# Patient Record
Sex: Male | Born: 1965 | Race: White | Hispanic: No | Marital: Married | Smoking: Current some day smoker
Health system: Southern US, Community
[De-identification: ages and names within clinical notes are randomized; demographics above are authoritative.]

## PROBLEM LIST (undated history)

## (undated) DIAGNOSIS — N200 Calculus of kidney: Secondary | ICD-10-CM

## (undated) DIAGNOSIS — I10 Essential (primary) hypertension: Secondary | ICD-10-CM

## (undated) HISTORY — PX: FRACTURE SURGERY: SHX138

## (undated) HISTORY — PX: ABDOMINAL SURGERY: SHX537

---

## 2004-10-25 ENCOUNTER — Inpatient Hospital Stay: Payer: Self-pay | Admitting: Internal Medicine

## 2004-10-28 ENCOUNTER — Inpatient Hospital Stay: Payer: Self-pay | Admitting: General Surgery

## 2004-11-10 ENCOUNTER — Emergency Department: Payer: Self-pay | Admitting: Emergency Medicine

## 2005-12-04 IMAGING — CT CT ABDOMEN AND PELVIS WITHOUT AND WITH CONTRAST
3 of 9 series · 14 of 32 positions shown, 19 images · non-contrast
Comparison: none

REASON FOR EXAM: (1) colovesical fistula, possible stone; (2) colovesical
fistula
COMMENTS:

[Series 4: without inspace · axial · non-contrast · 0.71mm/px · z∈[-438,-100]mm · 6 of 474 slices shown, 11 images]
[im 68/474  soft-tissue]
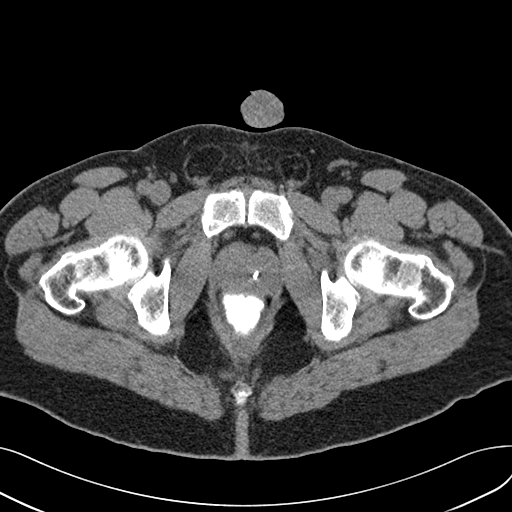
[im 68/474  bone]
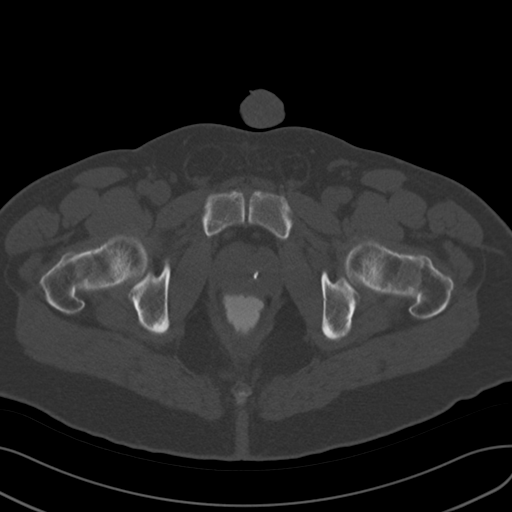
[im 136/474  soft-tissue]
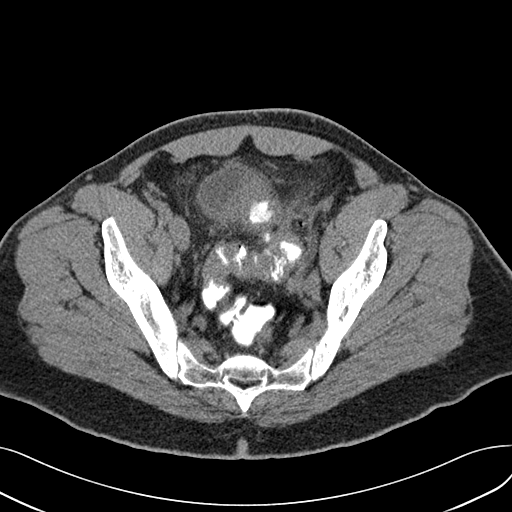
[im 203/474  soft-tissue]
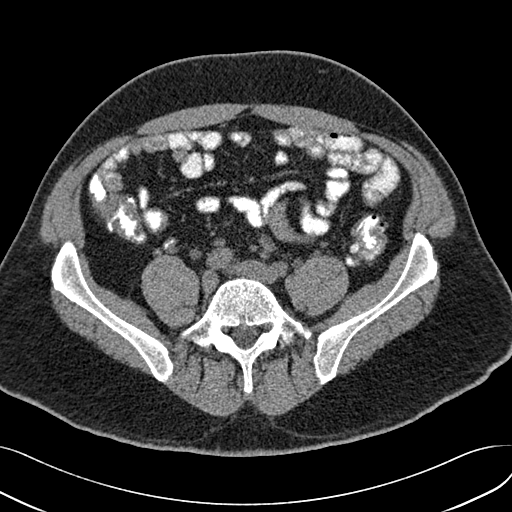
[im 203/474  lung]
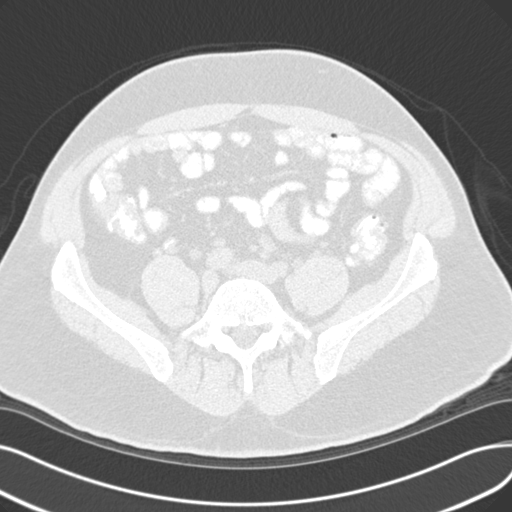
[im 271/474  soft-tissue]
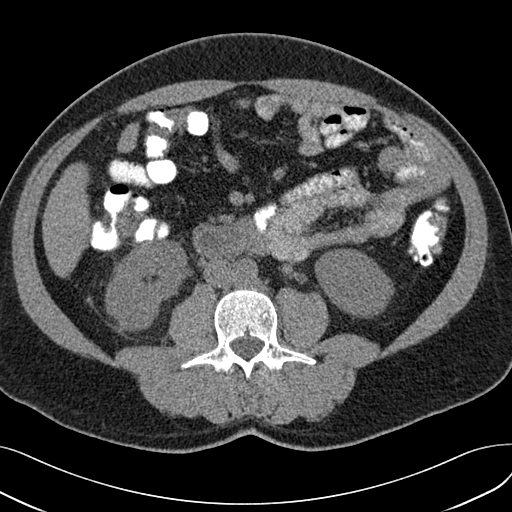
[im 271/474  lung]
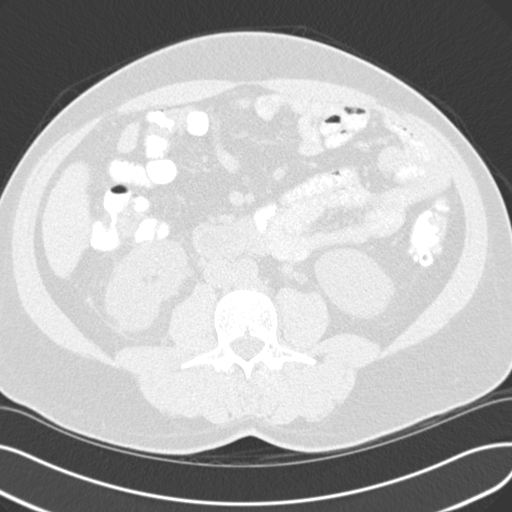
[im 338/474  soft-tissue]
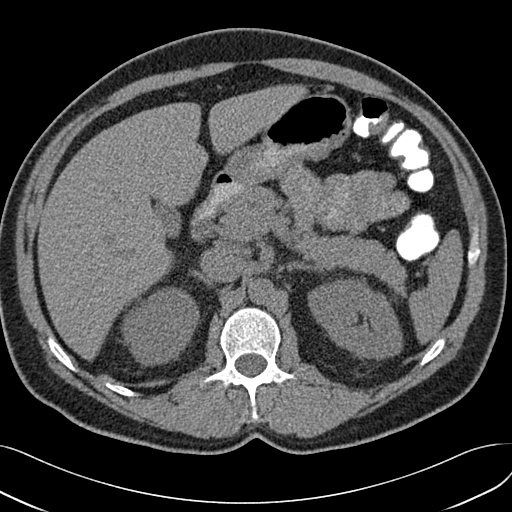
[im 338/474  lung]
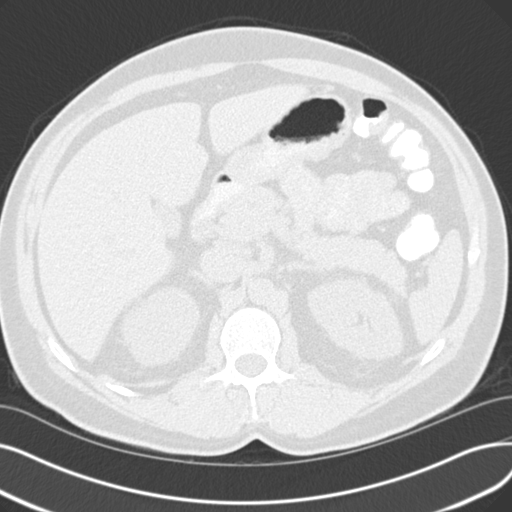
[im 406/474  soft-tissue]
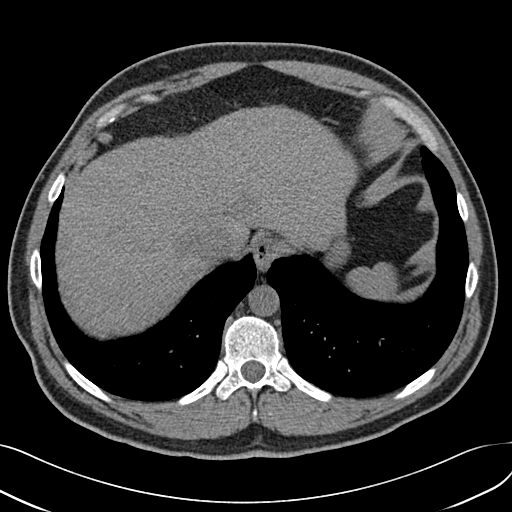
[im 406/474  lung]
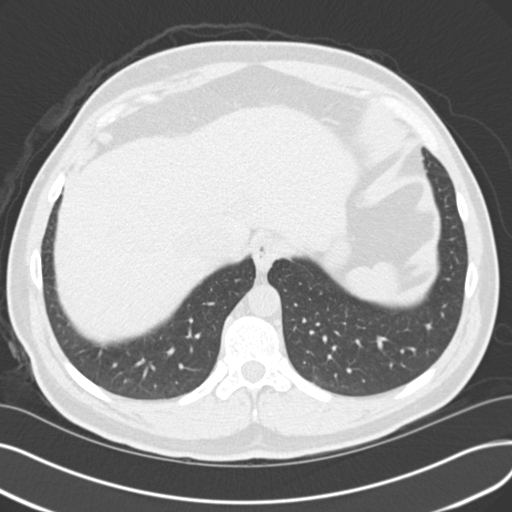

[Series 6: with inspace · axial · 0.74mm/px · z∈[-435,-85]mm · 6 of 491 slices shown]
[im 71/491  soft-tissue]
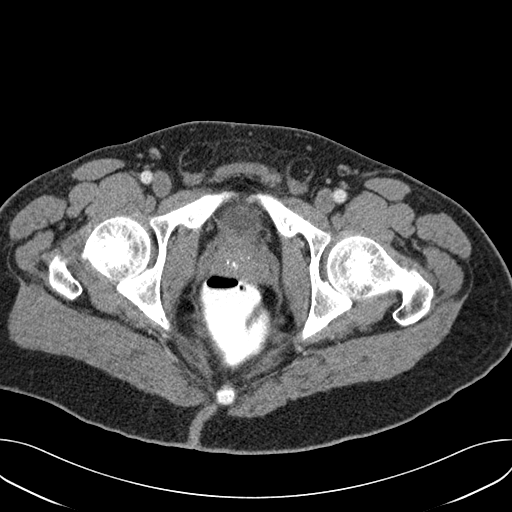
[im 141/491  soft-tissue]
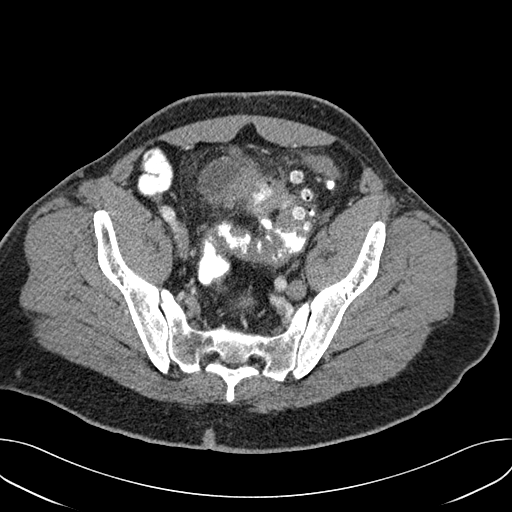
[im 211/491  soft-tissue]
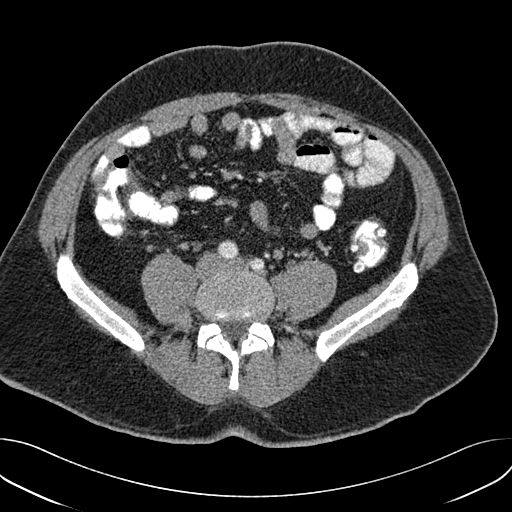
[im 281/491  soft-tissue]
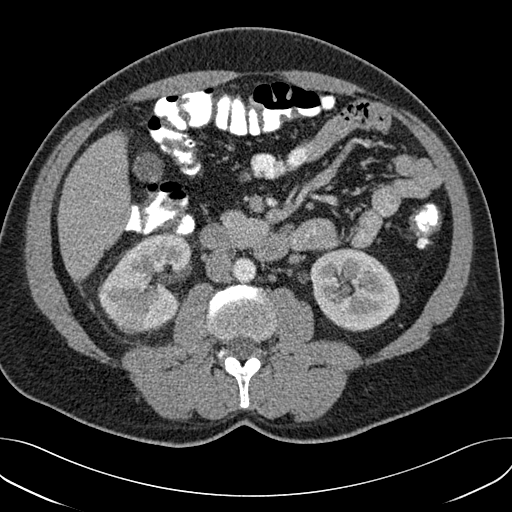
[im 351/491  soft-tissue]
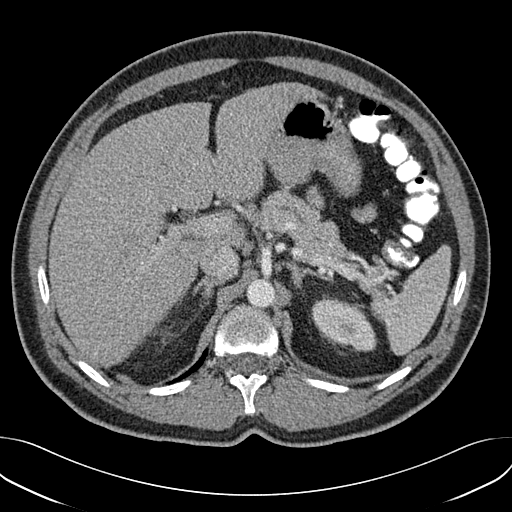
[im 421/491  soft-tissue]
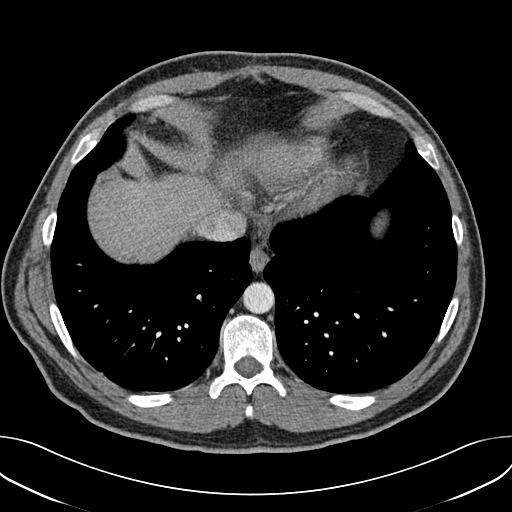

[Series 8: inspace · axial · 0.74mm/px · z∈[-437,-369]mm · 2 of 479 slices shown]
[im 69/479  soft-tissue]
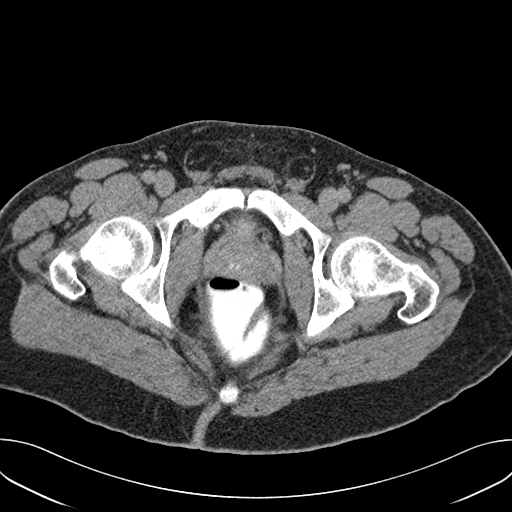
[im 137/479  soft-tissue]
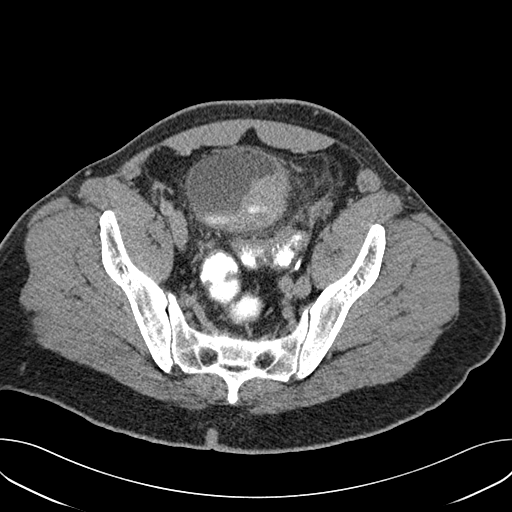

[14 of 32 positions shown; findings below may reference images not displayed]

PROCEDURE:     CT  - CT ABDOMEN / PELVIS  W/WO  - October 29, 2004  [DATE]

RESULT:     Non-contrast CT scan of the abdomen and pelvis demonstrates
thickening of the wall of the sigmoid colon with what appears to be oral
contrast or rectal contrast passing into the bladder from the LEFT lateral
aspect.  There appears to be a fistulous communication likely secondary to
diverticulosis given the large number of diverticula seen in the sigmoid
region.  There also appears to be edema of the posterior LEFT to LEFT
lateral bladder wall region.  The possibility of hematoma is a
consideration.  Neoplasm in this area is also a consideration but
statistically may be somewhat less likely.  There is some minimal
perinephric stranding.  No definite renal calculi are demonstrated.  There
is no evidence of inguinal adenopathy.  The kidneys enhance normally
following intravenous administration of iodinated contrast.  The other
abdominal and pelvic viscera appear to be grossly normal.  The lung bases
are clear.  The excretory phase images show no obstructive changes.
IMPRESSION: 1.     Findings suggestive of a fistulous communication between the sigmoid
colon and the posterior lateral superior aspect of the bladder on the LEFT
side.  There appears to be some significant wall thickening or edema.  The
possibility of a focal hematoma could not be excluded.
2.     There is also thickening of the sigmoid colon itself which can be
consistent with diverticulosis and diverticulitis.  Multiple diverticula are
present.
3.     Otherwise unremarkable examination.

## 2009-07-18 ENCOUNTER — Emergency Department: Payer: Self-pay | Admitting: Emergency Medicine

## 2009-11-21 ENCOUNTER — Emergency Department: Payer: Self-pay | Admitting: Internal Medicine

## 2009-11-22 ENCOUNTER — Emergency Department: Payer: Self-pay | Admitting: Emergency Medicine

## 2014-04-20 ENCOUNTER — Ambulatory Visit: Payer: Self-pay | Admitting: Physician Assistant

## 2014-06-09 ENCOUNTER — Ambulatory Visit: Payer: Self-pay | Admitting: Physician Assistant

## 2015-07-15 IMAGING — CR DG ANKLE COMPLETE 3+V*L*
3 series · 3 of 3 positions shown · non-contrast
Comparison: None.

CLINICAL DATA: Left ankle pain post injury

EXAM:
LEFT ANKLE COMPLETE - 3+ VIEW

[ankle ap]
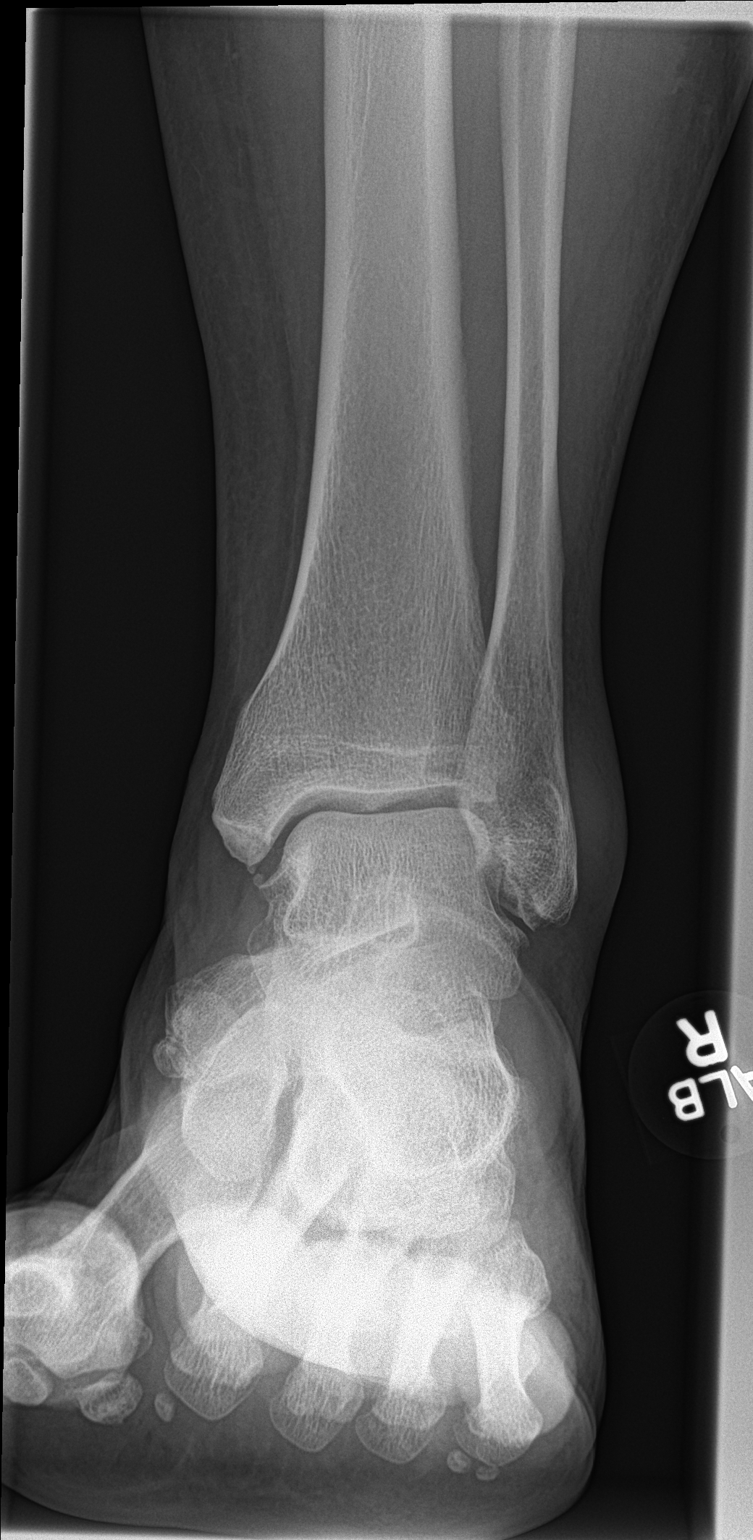

[ankle obl]
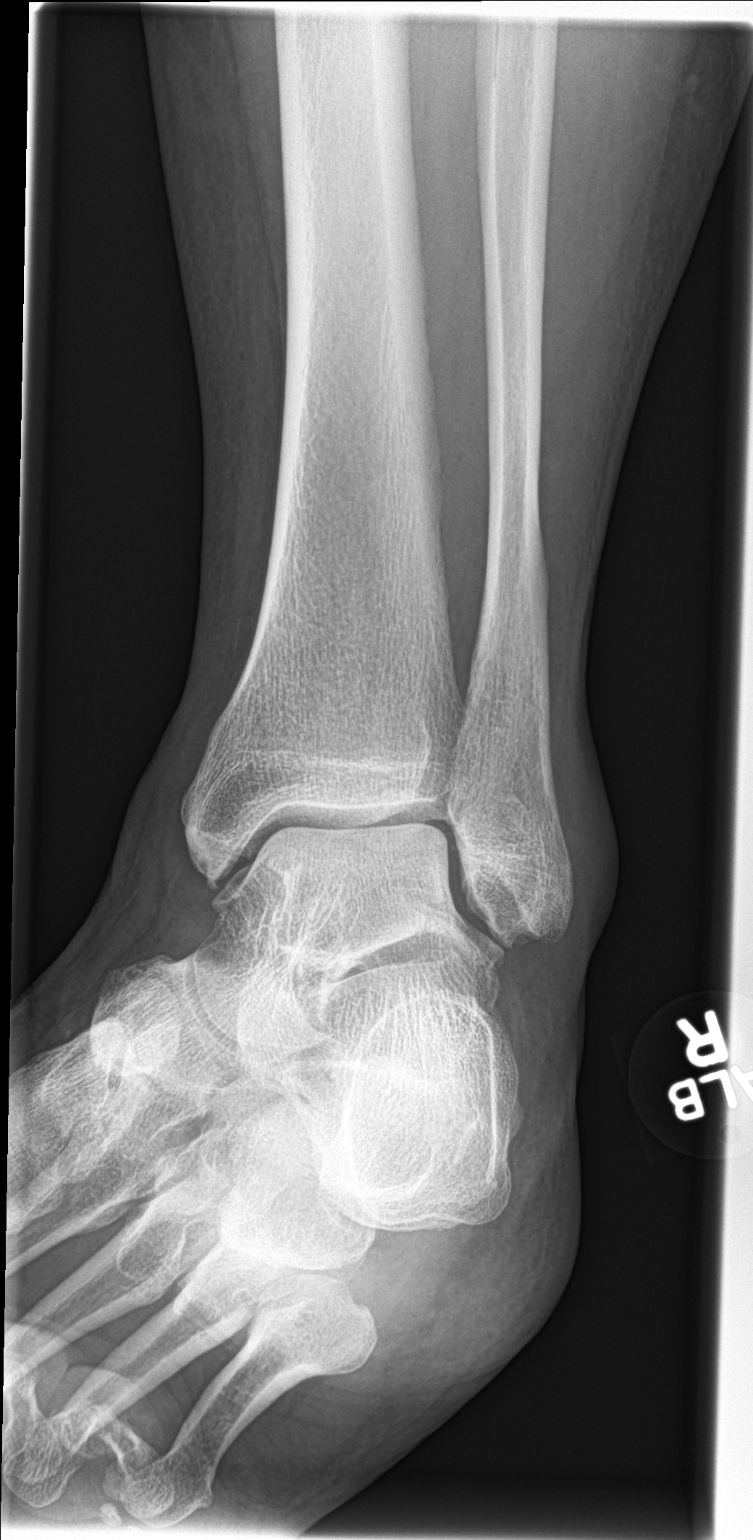

[ankle lat]
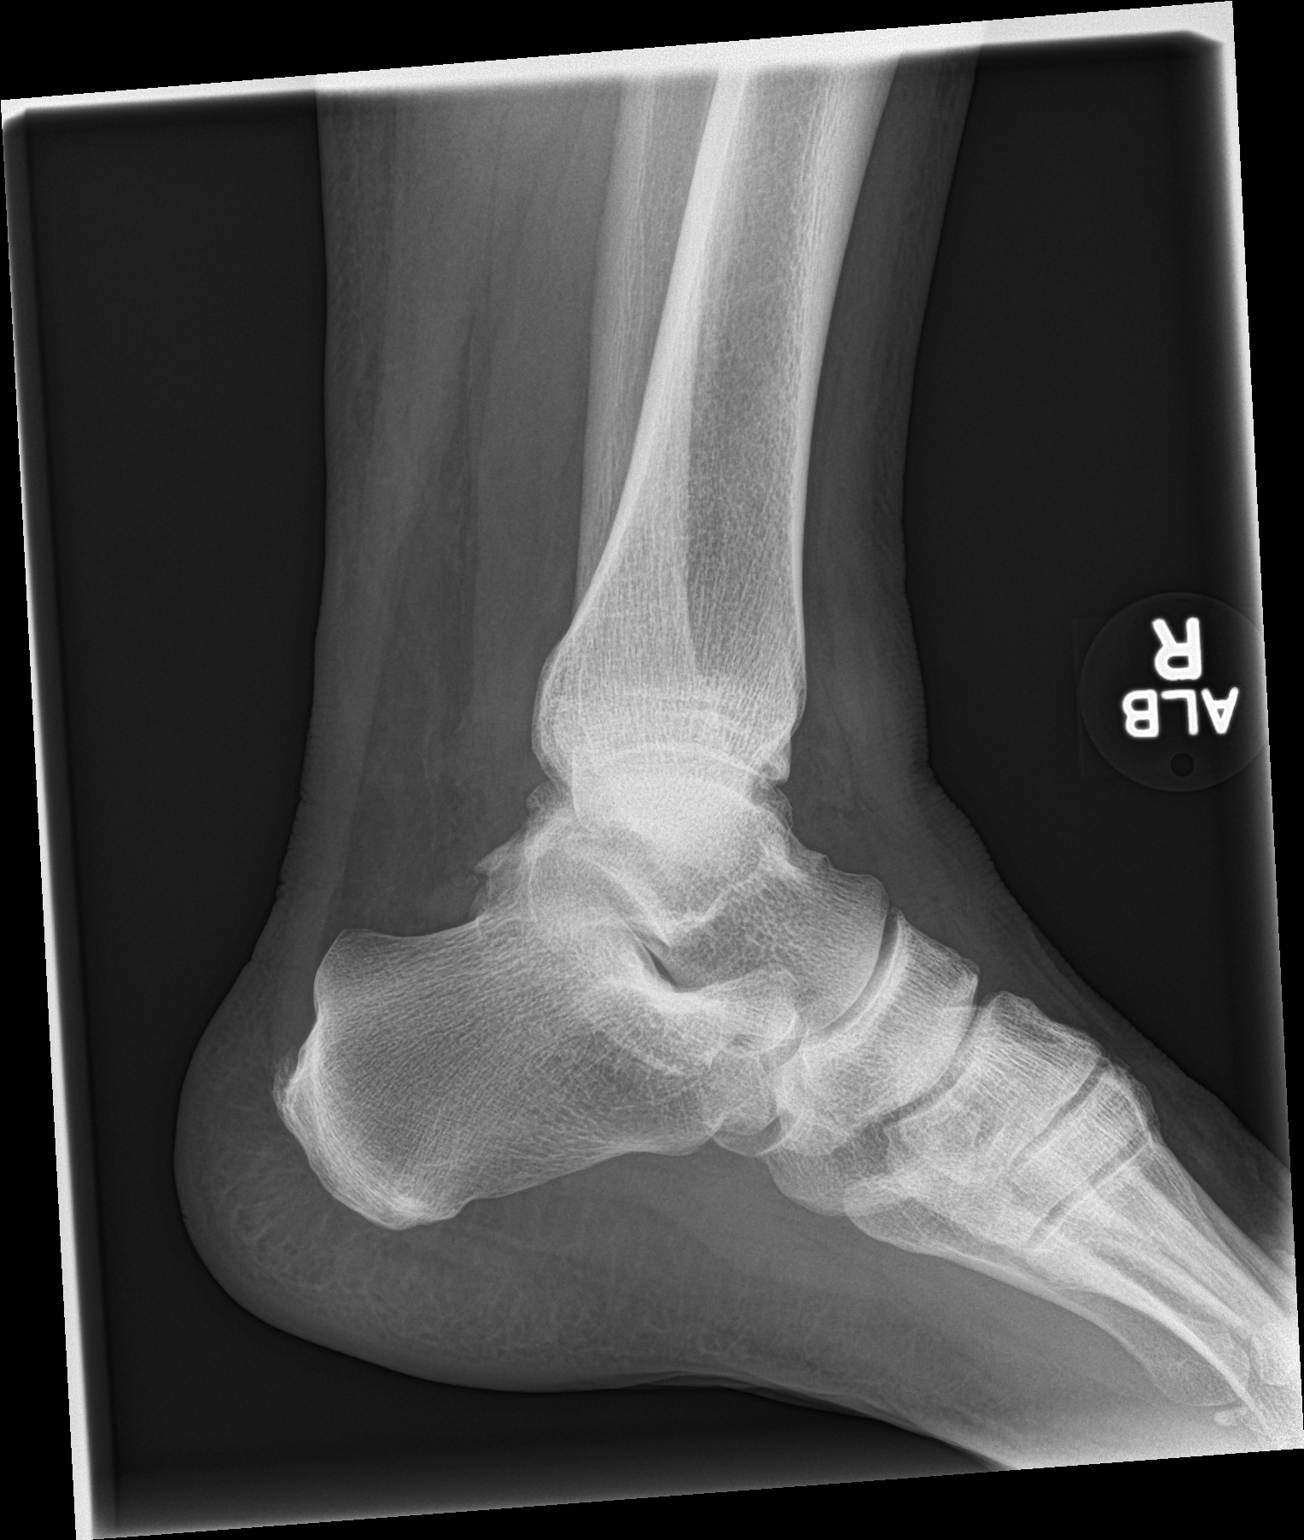

[3 of 3 positions shown; findings below may reference images not displayed]

FINDINGS: Three views of left ankle submitted. No acute fracture or
subluxation. There is soft tissue swelling adjacent to lateral
malleolus.
IMPRESSION: No acute fracture or subluxation. Soft tissue swelling adjacent to
lateral malleolus.

## 2015-09-07 ENCOUNTER — Emergency Department
Admission: EM | Admit: 2015-09-07 | Discharge: 2015-09-07 | Disposition: A | Discharge status: Home / Self Care | Payer: 59 | Attending: Emergency Medicine | Admitting: Emergency Medicine

## 2015-09-07 DIAGNOSIS — N492 Inflammatory disorders of scrotum: Secondary | ICD-10-CM | POA: Diagnosis not present

## 2015-09-07 DIAGNOSIS — Z87891 Personal history of nicotine dependence: Secondary | ICD-10-CM | POA: Diagnosis not present

## 2015-09-07 MED ORDER — SULFAMETHOXAZOLE-TRIMETHOPRIM 800-160 MG PO TABS: 1.0000 | ORAL_TABLET | Freq: Two times a day (BID) | ORAL | Status: DC

## 2015-09-07 MED ORDER — OXYCODONE-ACETAMINOPHEN 7.5-325 MG PO TABS: 1.0000 | ORAL_TABLET | Freq: Four times a day (QID) | ORAL | Status: DC | PRN

## 2015-09-07 MED ORDER — LIDOCAINE-EPINEPHRINE (PF) 1 %-1:200000 IJ SOLN
INTRAMUSCULAR | Status: AC
  Filled 2015-09-07: qty 30

## 2015-09-07 MED ORDER — LIDOCAINE-EPINEPHRINE 2 %-1:100000 IJ SOLN: 30.0000 mL | Freq: Once | INTRAMUSCULAR | Status: DC

## 2015-09-07 MED ORDER — HYDROMORPHONE HCL 1 MG/ML IJ SOLN
1.0000 mg | Freq: Once | INTRAMUSCULAR | Status: AC
  Administered 2015-09-07: 1 mg via INTRAMUSCULAR
  Filled 2015-09-07: qty 1

## 2015-09-07 MED ORDER — IBUPROFEN 800 MG PO TABS: 800.0000 mg | ORAL_TABLET | Freq: Three times a day (TID) | ORAL | Status: DC | PRN

## 2015-09-07 NOTE — ED Notes (Signed)
Pt c/o abscess in the crease of the thigh near the scrotum for the past week.

## 2015-09-07 NOTE — ED Notes (Signed)
States he noticed a possible abscess area to left groin area yesterday,..larger today

## 2015-09-07 NOTE — ED Notes (Signed)
Pt will be d/c once abcess is drained and d/c papers are placed.

## 2015-09-07 NOTE — ED Notes (Signed)
Rash noted to  Forehead yesterday with increased pain

## 2015-09-07 NOTE — ED Provider Notes (Signed)
[facility] Emergency Department Provider Note  ____________________________________________  Time seen: Approximately 12:30 PM  I have reviewed the triage vital signs and the nursing notes.   HISTORY  Chief Complaint Abscess    HPI Lawrence Cervantes is a 49 y.o. male patient presents today with erythematous nodular lesion the base of the left scrotum. Patient's the area has been increasing groin the past week and a half. Patient state last week he has some mild discharge from the area. Patient is tender episode approximately 6 years ago. Patient is rating his pain as a 10 over 10. Patient stated no palliative measures taken for this complaint.   History reviewed. No pertinent past medical history.  There are no active problems to display for this patient.   Past Surgical History  Procedure Laterality Date  . Abdominal surgery    . Fracture surgery      Current Outpatient Rx  Name  Route  Sig  Dispense  Refill  . ibuprofen (ADVIL,MOTRIN) 800 MG tablet   Oral   Take 1 tablet (800 mg total) by mouth every 8 (eight) hours as needed for moderate pain.   15 tablet   0   . oxyCODONE-acetaminophen (PERCOCET) 7.5-325 MG tablet   Oral   Take 1 tablet by mouth every 6 (six) hours as needed for severe pain.   12 tablet   0   . sulfamethoxazole-trimethoprim (BACTRIM DS,SEPTRA DS) 800-160 MG tablet   Oral   Take 1 tablet by mouth 2 (two) times daily.   20 tablet   0     Allergies Review of patient's allergies indicates no known allergies.  No family history on file.  Social History Social History  Substance Use Topics  . Smoking status: Former Games developer  . Smokeless tobacco: None  . Alcohol Use: No    Review of Systems Constitutional: No fever/chills Eyes: No visual changes. ENT: No sore throat. Cardiovascular: Denies chest pain. Respiratory: Denies shortness of breath. Gastrointestinal: No abdominal pain.  No nausea, no vomiting.  No  diarrhea.  No constipation. Genitourinary: Negative for dysuria. Musculoskeletal: Negative for back pain. Skin: Negative for rash. Redness swellingthe left Neurological: Negative for headaches, focal weakness or numbness.  10-point ROS otherwise negative.  ____________________________________________   PHYSICAL EXAM:  VITAL SIGNS: ED Triage Vitals  Enc Vitals Group     BP 09/07/15 1204 125/98 mmHg     Pulse Rate 09/07/15 1204 108     Resp 09/07/15 1204 18     Temp 09/07/15 1204 98.4 F (36.9 C)     Temp Source 09/07/15 1204 Oral     SpO2 09/07/15 1204 95 %     Weight 09/07/15 1204 285 lb (129.275 kg)     Height 09/07/15 1204  (1.803 m)     Head Cir --      Peak Flow --      Pain Score 09/07/15 1205 10     Pain Loc --      Pain Edu? --      Excl. in GC? --     Constitutional: Alert and oriented. Well appearing and in no acute distress. Eyes: Conjunctivae are normal. PERRL. EOMI. Head: Atraumatic. Nose: No congestion/rhinnorhea. Mouth/Throat: Mucous membranes are moist.  Oropharynx non-erythematous. Neck: No stridor.   Hematological/Lymphatic/Immunilogical: No cervical lymphadenopathy. Cardiovascular: Normal rate, regular rhythm. Grossly normal heart sounds.  Good peripheral circulation. Respiratory: Normal respiratory effort.  No retractions. Lungs CTAB. Gastrointestinal: Soft and nontender. No distention. No abdominal  bruits. No CVA tenderness. Musculoskeletal: No lower extremity tenderness nor edema.  No joint effusions. Neurologic:  Normal speech and language. No gross focal neurologic deficits are appreciated. No gait instability. Skin:  Skin is warm, dry and intact. No rash noted. Erythematous/edematous nodule lesion base of the right scrotum. Psychiatric: Mood and affect are normal. Speech and behavior are normal.  ____________________________________________   LABS (all labs ordered are listed, but only abnormal results are displayed)  Labs Reviewed -  No data to display ____________________________________________  EKG   ____________________________________________  RADIOLOGY   ____________________________________________   PROCEDURES  Procedure(s) performed: See procedure note  Critical Care performed: No  ___________________________INCISION AND DRAINAGE Performed by: Joni Reining Consent: Verbal consent obtained. Risks and benefits: risks, benefits and alternatives were discussed Type: abscess  Body area: Scrotum Anesthesia: local infiltration  Incision was made with a scalpel. 11 blade  Local anesthetic: Lidocaine with epinephrine   Anesthetic total: 10 MLS  Complexity: complex Blunt dissection to break up loculations  Drainage: purulent  Drainage amount: Large Packing material: 1/4 in iodoform gauze  Patient tolerance: Patient tolerated the procedure well with no immediate complications.   _______________   INITIAL IMPRESSION / ASSESSMENT AND PLAN / ED COURSE  Pertinent labs & imaging results that were available during my care of the patient were reviewed by me and considered in my medical decision making (see chart for details).  Scrotum abscess area was I&D patient given prescription for Bactrim and Percocets. Patient get advised on wound care. She will return back in 2 days reevaluation. ____________________________________________   FINAL CLINICAL IMPRESSION(S) / ED DIAGNOSES  Final diagnoses:  Scrotum, abscess      Lawrence REEDER, PA-C 09/07/15 1331  Emily Filbert, MD 09/07/15 [phone]

## 2015-09-09 ENCOUNTER — Encounter: Payer: Self-pay | Admitting: Emergency Medicine

## 2015-09-09 ENCOUNTER — Emergency Department
Admission: EM | Admit: 2015-09-09 | Discharge: 2015-09-09 | Disposition: A | Discharge status: Home / Self Care | Payer: 59 | Attending: Emergency Medicine | Admitting: Emergency Medicine

## 2015-09-09 DIAGNOSIS — Z4801 Encounter for change or removal of surgical wound dressing: Secondary | ICD-10-CM | POA: Diagnosis present

## 2015-09-09 DIAGNOSIS — Z79899 Other long term (current) drug therapy: Secondary | ICD-10-CM | POA: Diagnosis not present

## 2015-09-09 DIAGNOSIS — Z87891 Personal history of nicotine dependence: Secondary | ICD-10-CM | POA: Diagnosis not present

## 2015-09-09 DIAGNOSIS — Z4800 Encounter for change or removal of nonsurgical wound dressing: Secondary | ICD-10-CM

## 2015-09-09 NOTE — ED Provider Notes (Signed)
[facility] Emergency Department Provider Note  ____________________________________________  Time seen: Approximately 9:56 AM  I have reviewed the triage vital signs and the nursing notes.   HISTORY  Chief Complaint Wound Check   HPI Lawrence Cervantes is a 49 y.o. male is here for packing removal from an abscess to his left scrotum that was lanced on 9/27. He states that it has drained a good bit and he continues taking his antibiotic's without any difficulty. Pain is decreased to 2/10. Denies any fever or chills.  History reviewed. No pertinent past medical history.  There are no active problems to display for this patient.   Past Surgical History  Procedure Laterality Date  . Abdominal surgery    . Fracture surgery      Current Outpatient Rx  Name  Route  Sig  Dispense  Refill  . ibuprofen (ADVIL,MOTRIN) 800 MG tablet   Oral   Take 1 tablet (800 mg total) by mouth every 8 (eight) hours as needed for moderate pain.   15 tablet   0   . oxyCODONE-acetaminophen (PERCOCET) 7.5-325 MG tablet   Oral   Take 1 tablet by mouth every 6 (six) hours as needed for severe pain.   12 tablet   0   . sulfamethoxazole-trimethoprim (BACTRIM DS,SEPTRA DS) 800-160 MG tablet   Oral   Take 1 tablet by mouth 2 (two) times daily.   20 tablet   0     Allergies Review of patient's allergies indicates no known allergies.  No family history on file.  Social History Social History  Substance Use Topics  . Smoking status: Former Games developer  . Smokeless tobacco: None  . Alcohol Use: No    Review of Systems Constitutional: No fever/chills Cardiovascular: Denies chest pain. Respiratory: Denies shortness of breath. Gastrointestinal: No abdominal pain.  No nausea, no vomiting.  Genitourinary: Negative for dysuria. Musculoskeletal: Negative for back pain. Skin: Negative for rash. Resolving abscess Neurological: Negative for headaches, focal weakness or  numbness.  10-point ROS otherwise negative.  ____________________________________________   PHYSICAL EXAM:  VITAL SIGNS: ED Triage Vitals  Enc Vitals Group     BP 09/09/15 0932 141/90 mmHg     Pulse Rate 09/09/15 0932 71     Resp 09/09/15 0932 20     Temp 09/09/15 0932 98.6 F (37 C)     Temp Source 09/09/15 0932 Oral     SpO2 09/09/15 0932 95 %     Weight 09/09/15 0932 280 lb (127.007 kg)     Height 09/09/15 0932  (1.803 m)     Head Cir --      Peak Flow --      Pain Score 09/09/15 0917 2     Pain Loc --      Pain Edu? --      Excl. in GC? --     Constitutional: Alert and oriented. Well appearing and in no acute distress. Eyes: Conjunctivae are normal. PERRL. EOMI. Head: Atraumatic. Nose: No congestion/rhinnorhea. Neck: No stridor.   Cardiovascular: Normal rate, regular rhythm. Grossly normal heart sounds.  Good peripheral circulation. Respiratory: Normal respiratory effort.  No retractions. Lungs CTAB. Gastrointestinal: Soft and nontender. No distention. Musculoskeletal: No lower extremity tenderness nor edema.  No joint effusions. Neurologic:  Normal speech and language. No gross focal neurologic deficits are appreciated. No gait instability. Skin:  Skin is warm, dry and intact. No rash noted. Left scrotum without any packing but the incision remains open and  there is some minimal drainage. There is still slightly tender to touch. There is no erythema present Psychiatric: Mood and affect are normal. Speech and behavior are normal.  ____________________________________________   LABS (all labs ordered are listed, but only abnormal results are displayed)  Labs Reviewed - No data to display   PROCEDURES  Procedure(s) performed: None  Critical Care performed: No  ____________________________________________   INITIAL IMPRESSION / ASSESSMENT AND PLAN / ED COURSE  Pertinent labs & imaging results that were available during my care of the patient were  reviewed by me and considered in my medical decision making (see chart for details).  Patient is to continue taking his antibiotic's. He was given the name of surgeon should he continue to have problems. ____________________________________________   FINAL CLINICAL IMPRESSION(S) / ED DIAGNOSES  Final diagnoses:  Change or removal of wound dressing      Tommi Rumps, PA-C 09/09/15 1157  Myrna Blazer, MD 09/09/15 1213

## 2015-09-09 NOTE — ED Notes (Signed)
Here for abscess recheck   

## 2016-04-13 ENCOUNTER — Encounter: Payer: Self-pay | Admitting: Emergency Medicine

## 2016-04-13 ENCOUNTER — Emergency Department
Admission: EM | Admit: 2016-04-13 | Discharge: 2016-04-13 | Disposition: A | Discharge status: Home / Self Care | Payer: BLUE CROSS/BLUE SHIELD | Attending: Emergency Medicine | Admitting: Emergency Medicine

## 2016-04-13 ENCOUNTER — Emergency Department: Payer: BLUE CROSS/BLUE SHIELD

## 2016-04-13 DIAGNOSIS — Z791 Long term (current) use of non-steroidal anti-inflammatories (NSAID): Secondary | ICD-10-CM | POA: Insufficient documentation

## 2016-04-13 DIAGNOSIS — Z792 Long term (current) use of antibiotics: Secondary | ICD-10-CM | POA: Diagnosis not present

## 2016-04-13 DIAGNOSIS — N200 Calculus of kidney: Secondary | ICD-10-CM | POA: Diagnosis not present

## 2016-04-13 DIAGNOSIS — Z87891 Personal history of nicotine dependence: Secondary | ICD-10-CM | POA: Diagnosis not present

## 2016-04-13 DIAGNOSIS — R109 Unspecified abdominal pain: Secondary | ICD-10-CM | POA: Diagnosis present

## 2016-04-13 HISTORY — DX: Calculus of kidney: N20.0

## 2016-04-13 LAB — URINALYSIS COMPLETE WITH MICROSCOPIC (ARMC ONLY)
BILIRUBIN URINE: NEGATIVE
Bacteria, UA: NONE SEEN
Glucose, UA: NEGATIVE mg/dL
Ketones, ur: NEGATIVE mg/dL
LEUKOCYTES UA: NEGATIVE
Nitrite: NEGATIVE
PH: 5 (ref 5.0–8.0)
PROTEIN: 30 mg/dL — AB
Specific Gravity, Urine: 1.027 (ref 1.005–1.030)

## 2016-04-13 LAB — COMPREHENSIVE METABOLIC PANEL
ALBUMIN: 4 g/dL (ref 3.5–5.0)
ALT: 39 U/L (ref 17–63)
ANION GAP: 10 (ref 5–15)
AST: 29 U/L (ref 15–41)
Alkaline Phosphatase: 101 U/L (ref 38–126)
BILIRUBIN TOTAL: 0.7 mg/dL (ref 0.3–1.2)
BUN: 16 mg/dL (ref 6–20)
CO2: 22 mmol/L (ref 22–32)
Calcium: 8.8 mg/dL — ABNORMAL LOW (ref 8.9–10.3)
Chloride: 103 mmol/L (ref 101–111)
Creatinine, Ser: 1.2 mg/dL (ref 0.61–1.24)
GFR calc non Af Amer: >60 mL/min (ref >60)
GLUCOSE: 112 mg/dL — AB (ref 65–99)
POTASSIUM: 4.1 mmol/L (ref 3.5–5.1)
Sodium: 135 mmol/L (ref 135–145)
TOTAL PROTEIN: 8.1 g/dL (ref 6.5–8.1)

## 2016-04-13 LAB — CBC WITH DIFFERENTIAL/PLATELET
BASOS ABS: 0.1 10*3/uL (ref 0–0.1)
EOS ABS: 0.2 10*3/uL (ref 0–0.7)
Eosinophils Relative: 2 %
HEMATOCRIT: 42.9 % (ref 40.0–52.0)
HEMOGLOBIN: 14.3 g/dL (ref 13.0–18.0)
Lymphocytes Relative: 18 %
Lymphs Abs: 2.1 10*3/uL (ref 1.0–3.6)
MCH: 29.6 pg (ref 26.0–34.0)
MCHC: 33.4 g/dL (ref 32.0–36.0)
MCV: 88.5 fL (ref 80.0–100.0)
Monocytes Absolute: 1.1 10*3/uL — ABNORMAL HIGH (ref 0.2–1.0)
NEUTROS ABS: 8.6 10*3/uL — AB (ref 1.4–6.5)
Platelets: 356 10*3/uL (ref 150–440)
RBC: 4.85 MIL/uL (ref 4.40–5.90)
RDW: 13.8 % (ref 11.5–14.5)
WBC: 12.1 10*3/uL — AB (ref 3.8–10.6)

## 2016-04-13 MED ORDER — SODIUM CHLORIDE 0.9 % IV BOLUS (SEPSIS)
1000.0000 mL | Freq: Once | INTRAVENOUS | Status: AC
  Administered 2016-04-13: 1000 mL via INTRAVENOUS

## 2016-04-13 MED ORDER — MORPHINE SULFATE (PF) 4 MG/ML IV SOLN
INTRAVENOUS | Status: AC
  Administered 2016-04-13: 4 mg via INTRAVENOUS
  Filled 2016-04-13: qty 1

## 2016-04-13 MED ORDER — TAMSULOSIN HCL 0.4 MG PO CAPS: 0.4000 mg | ORAL_CAPSULE | Freq: Every day | ORAL | Status: DC

## 2016-04-13 MED ORDER — OXYCODONE-ACETAMINOPHEN 5-325 MG PO TABS: 1.0000 | ORAL_TABLET | Freq: Four times a day (QID) | ORAL | Status: DC | PRN

## 2016-04-13 MED ORDER — KETOROLAC TROMETHAMINE 30 MG/ML IJ SOLN
30.0000 mg | Freq: Once | INTRAMUSCULAR | Status: AC
  Administered 2016-04-13: 30 mg via INTRAVENOUS
  Filled 2016-04-13: qty 1

## 2016-04-13 MED ORDER — KETOROLAC TROMETHAMINE 10 MG PO TABS: 10.0000 mg | ORAL_TABLET | Freq: Four times a day (QID) | ORAL | Status: DC | PRN

## 2016-04-13 MED ORDER — ONDANSETRON HCL 4 MG/2ML IJ SOLN
INTRAMUSCULAR | Status: AC
  Administered 2016-04-13: 4 mg via INTRAVENOUS
  Filled 2016-04-13: qty 2

## 2016-04-13 MED ORDER — ONDANSETRON HCL 4 MG/2ML IJ SOLN
4.0000 mg | Freq: Once | INTRAMUSCULAR | Status: AC
  Administered 2016-04-13: 4 mg via INTRAVENOUS

## 2016-04-13 MED ORDER — MORPHINE SULFATE (PF) 4 MG/ML IV SOLN
4.0000 mg | Freq: Once | INTRAVENOUS | Status: AC
  Administered 2016-04-13: 4 mg via INTRAVENOUS

## 2016-04-13 MED ORDER — FENTANYL CITRATE (PF) 100 MCG/2ML IJ SOLN
100.0000 ug | Freq: Once | INTRAMUSCULAR | Status: AC
  Administered 2016-04-13: 100 ug via INTRAVENOUS
  Filled 2016-04-13: qty 2

## 2016-04-13 NOTE — ED Provider Notes (Signed)
[facility] Emergency Department Provider Note  ____________________________________________  Time seen: Approximately 11:18 AM  I have reviewed the triage vital signs and the nursing notes.   HISTORY  Chief Complaint Flank Pain    HPI Lawrence Cervantes is a 50 y.o. male who presents to emergency department complaining of right flank pain and hematuria for one day. Patient states that he was awaken this morning with sharp right-sided flank pain. 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 He does report a history of kidney stones and states the pain is consistent with this. Patient reports that he has a trouble starting to urinate. Patient does report hematuria. He denies any pain radiating into the groin. He denies fevers or chills. He denies any abdominal pain, nausea vomiting, diarrhea or constipation.   Past Medical History  Diagnosis Date  . Kidney stone     There are no active problems to display for this patient.   Past Surgical History  Procedure Laterality Date  . Abdominal surgery    . Fracture surgery      Current Outpatient Rx  Name  Route  Sig  Dispense  Refill  . ibuprofen (ADVIL,MOTRIN) 800 MG tablet   Oral   Take 1 tablet (800 mg total) by mouth every 8 (eight) hours as needed for moderate pain.   15 tablet   0   . ketorolac (TORADOL) 10 MG tablet   Oral   Take 1 tablet (10 mg total) by mouth every 6 (six) hours as needed.   20 tablet   0   . oxyCODONE-acetaminophen (ROXICET) 5-325 MG tablet   Oral   Take 1 tablet by mouth every 6 (six) hours as needed for severe pain.   20 tablet   0   . sulfamethoxazole-trimethoprim (BACTRIM DS,SEPTRA DS) 800-160 MG tablet   Oral   Take 1 tablet by mouth 2 (two) times daily.   20 tablet   0   . tamsulosin (FLOMAX) 0.4 MG CAPS capsule   Oral   Take 1 capsule (0.4 mg total)  by mouth daily.   15 capsule   0     Allergies Review of patient's allergies indicates no known allergies.  No family history on file.  Social History Social History  Substance Use Topics  . Smoking status: Former Games developermoker  . Smokeless tobacco: None  . Alcohol Use: No     Review of Systems  Constitutional: No fever/chills Eyes: No visual changes. No discharge ENT: No upper respiratory complaints. Cardiovascular: no chest pain. Respiratory: no cough. No SOB. Gastrointestinal: No abdominal pain.  No nausea, no vomiting.  No diarrhea.  No constipation. Genitourinary: Negative for dysuria. Positive for hematuria. Positive for right flank pain. Musculoskeletal: Negative for musculoskeletal pain. Skin: Negative for rash, abrasions, lacerations, ecchymosis. Neurological: Negative for headaches, focal weakness or numbness. 10-point ROS otherwise negative.  ____________________________________________   PHYSICAL EXAM:  VITAL SIGNS: ED Triage Vitals  Enc Vitals Group     BP --      Pulse --      Resp --      Temp --      Temp src --      SpO2 --      Weight --      Height --      Head Cir --      Peak Flow --  Pain Score 04/13/16 1114 10     Pain Loc --      Pain Edu? --      Excl. in GC? --      Constitutional: Alert and oriented. Well appearing and in no acute distress. Eyes: Conjunctivae are normal. PERRL. EOMI. Head: Atraumatic. Cardiovascular: Normal rate, regular rhythm. Normal S1 and S2.  Good peripheral circulation. Respiratory: Normal respiratory effort without tachypnea or retractions. Lungs CTAB. Good air entry to the bases with no decreased or absent breath sounds. Gastrointestinal: Bowel sounds 4 quadrants. Soft and nontender to palpation. No guarding or rigidity. No palpable masses. No distention. No CVA tenderness. Musculoskeletal: Full range of motion to all extremities. No gross deformities appreciated. Neurologic:  Normal speech and  language. No gross focal neurologic deficits are appreciated.  Skin:  Skin is warm, dry and intact. No rash noted. Psychiatric: Mood and affect are normal. Speech and behavior are normal. Patient exhibits appropriate insight and judgement.   ____________________________________________   LABS (all labs ordered are listed, but only abnormal results are displayed)  Labs Reviewed  CBC WITH DIFFERENTIAL/PLATELET - Abnormal; Notable for the following:    WBC 12.1 (*)    Neutro Abs 8.6 (*)    Monocytes Absolute 1.1 (*)    All other components within normal limits  COMPREHENSIVE METABOLIC PANEL - Abnormal; Notable for the following:    Glucose, Bld 112 (*)    Calcium 8.8 (*)    All other components within normal limits  URINALYSIS COMPLETEWITH MICROSCOPIC ([facility] ONLY)   ____________________________________________  EKG   ____________________________________________  RADIOLOGY [name] [name], personally viewed and evaluated these images (plain radiographs) as part of my medical decision making, as well as reviewing the written report by the radiologist.  Ct Renal Stone Study  04/13/2016  CLINICAL DATA:  Right flank pain, gross hematuria. EXAM: CT ABDOMEN AND PELVIS WITHOUT CONTRAST TECHNIQUE: Multidetector CT imaging of the abdomen and pelvis was performed following the standard protocol without IV contrast. COMPARISON:  CT scan of October 29, 2004. FINDINGS: Visualized lung bases are unremarkable. No significant osseous abnormality is noted. No gallstones are noted. No focal abnormality is noted in the liver, spleen or pancreas on these unenhanced images. Adrenal glands appear normal. Bilateral nephrolithiasis is noted. Minimal right hydroureteronephrosis is noted with perinephric stranding without definite evidence of obstructing calculus. There is no evidence of bowel obstruction. No abnormal fluid collection is noted. Sigmoid diverticulosis is noted without inflammation.  Anastomotic sutures are seen involving the sigmoid colon. Urinary bladder is decompressed. No significant adenopathy is noted. IMPRESSION: Bilateral nephrolithiasis. Minimal right hydroureteronephrosis with perinephric stranding is noted without definite evidence of obstructing calculus. Potentially this may be secondary to ureteral spasm secondary to recently passed stone. Electronically Signed   By: Lupita Raider, M.D.   On: 04/13/2016 12:16    ____________________________________________    PROCEDURES  Procedure(s) performed:       Medications  fentaNYL (SUBLIMAZE) injection 100 mcg (not administered)  ketorolac (TORADOL) 30 MG/ML injection 30 mg (not administered)  sodium chloride 0.9 % bolus 1,000 mL (1,000 mLs Intravenous New Bag/Given 04/13/16 1133)  morphine 4 MG/ML injection 4 mg (4 mg Intravenous Given 04/13/16 1133)  ondansetron (ZOFRAN) injection 4 mg (4 mg Intravenous Given 04/13/16 1133)  fentaNYL (SUBLIMAZE) injection 100 mcg (100 mcg Intravenous Given 04/13/16 1225)     ____________________________________________   INITIAL IMPRESSION / ASSESSMENT AND PLAN / ED COURSE  Pertinent labs & imaging results that were available during  my care of the patient were reviewed by me and considered in my medical decision making (see chart for details).  Patient's diagnosis is consistent with Kidney stones. CT scan reveals kidney stones the bilateral kidneys but no signs of skin the stone and ureters. No signs of hydronephrosis or obstructing renal calculi. Exam is reassuring. Patient's pain was well managed here in the emergency department. Patient will be discharged home with pain medication, anti-inflammatories, and Flomax. He'll follow up with urology as needed.. Patient is given ED precautions to return to the ED for any worsening or new symptoms.     ____________________________________________  FINAL CLINICAL IMPRESSION(S) / ED DIAGNOSES  Final diagnoses:  Bilateral  kidney stones      NEW MEDICATIONS STARTED DURING THIS VISIT:  New Prescriptions   KETOROLAC (TORADOL) 10 MG TABLET    Take 1 tablet (10 mg total) by mouth every 6 (six) hours as needed.   OXYCODONE-ACETAMINOPHEN (ROXICET) 5-325 MG TABLET    Take 1 tablet by mouth every 6 (six) hours as needed for severe pain.   TAMSULOSIN (FLOMAX) 0.4 MG CAPS CAPSULE    Take 1 capsule (0.4 mg total) by mouth daily.        This chart was dictated using voice recognition software/Dragon. Despite best efforts to proofread, errors can occur which can change the meaning. Any change was purely unintentional.    Racheal Patches, PA-C 04/13/16 1322  Emily Filbert, MD 04/13/16 1434

## 2016-04-13 NOTE — ED Notes (Signed)
Pt comes into the ED via POV c/o right side flank pain and blood in his urine.  Patient has history of kidney stones and is having difficulty urinating today. Patient in NAD at this time.

## 2016-04-13 NOTE — Discharge Instructions (Signed)
Kidney Stones °Kidney stones (urolithiasis) are deposits that form inside your kidneys. The intense pain is caused by the stone moving through the urinary tract. When the stone moves, the ureter goes into spasm around the stone. The stone is usually passed in the urine.  °CAUSES  °· A disorder that makes certain neck glands produce too much parathyroid hormone (primary hyperparathyroidism). °· A buildup of uric acid crystals, similar to gout in your joints. °· Narrowing (stricture) of the ureter. °· A kidney obstruction present at birth (congenital obstruction). °· Previous surgery on the kidney or ureters. °· Numerous kidney infections. °SYMPTOMS  °· Feeling sick to your stomach (nauseous). °· Throwing up (vomiting). °· Blood in the urine (hematuria). °· Pain that usually spreads (radiates) to the groin. °· Frequency or urgency of urination. °DIAGNOSIS  °· Taking a history and physical exam. °· Blood or urine tests. °· CT scan. °· Occasionally, an examination of the inside of the urinary bladder (cystoscopy) is performed. °TREATMENT  °· Observation. °· Increasing your fluid intake. °· Extracorporeal shock wave lithotripsy--This is a noninvasive procedure that uses shock waves to break up kidney stones. °· Surgery may be needed if you have severe pain or persistent obstruction. There are various surgical procedures. Most of the procedures are performed with the use of small instruments. Only small incisions are needed to accommodate these instruments, so recovery time is minimized. °The size, location, and chemical composition are all important variables that will determine the proper choice of action for you. Talk to your health care provider to better understand your situation so that you will minimize the risk of injury to yourself and your kidney.  °HOME CARE INSTRUCTIONS  °· Drink enough water and fluids to keep your urine clear or pale yellow. This will help you to pass the stone or stone fragments. °· Strain  all urine through the provided strainer. Keep all particulate matter and stones for your health care provider to see. The stone causing the pain may be as small as a grain of salt. It is very important to use the strainer each and every time you pass your urine. The collection of your stone will allow your health care provider to analyze it and verify that a stone has actually passed. The stone analysis will often identify what you can do to reduce the incidence of recurrences. °· Only take over-the-counter or prescription medicines for pain, discomfort, or fever as directed by your health care provider. °· Keep all follow-up visits as told by your health care provider. This is important. °· Get follow-up X-rays if required. The absence of pain does not always mean that the stone has passed. It may have only stopped moving. If the urine remains completely obstructed, it can cause loss of kidney function or even complete destruction of the kidney. It is your responsibility to make sure X-rays and follow-ups are completed. Ultrasounds of the kidney can show blockages and the status of the kidney. Ultrasounds are not associated with any radiation and can be performed easily in a matter of minutes. °· Make changes to your daily diet as told by your health care provider. You may be told to: °¨ Limit the amount of salt that you eat. °¨ Eat 5 or more servings of fruits and vegetables each day. °¨ Limit the amount of meat, poultry, fish, and eggs that you eat. °· Collect a 24-hour urine sample as told by your health care provider. You may need to collect another urine sample every 6-12   months. °SEEK MEDICAL CARE IF: °· You experience pain that is progressive and unresponsive to any pain medicine you have been prescribed. °SEEK IMMEDIATE MEDICAL CARE IF:  °· Pain cannot be controlled with the prescribed medicine. °· You have a fever or shaking chills. °· The severity or intensity of pain increases over 18 hours and is not  relieved by pain medicine. °· You develop a new onset of abdominal pain. °· You feel faint or pass out. °· You are unable to urinate. °  °This information is not intended to replace advice given to you by your health care provider. Make sure you discuss any questions you have with your health care provider. °  °Document Released: 11/27/2005 Document Revised: 08/18/2015 Document Reviewed: 04/30/2013 °Elsevier Interactive Patient Education ©2016 [employer] ° °

## 2016-04-13 NOTE — ED Notes (Signed)
Patient transported to CT 

## 2016-04-13 NOTE — ED Notes (Signed)
Patient returned from radiology

## 2022-05-24 ENCOUNTER — Encounter: Payer: Self-pay | Admitting: Otolaryngology

## 2022-05-24 NOTE — Discharge Instructions (Signed)
[facility] ENDOSCOPIC SINUS SURGERY [facility]  What is Functional Endoscopic Sinus Surgery?  The Surgery involves making the natural openings of the sinuses larger by removing the bony partitions that separate the sinuses from the nasal cavity.  The natural sinus lining is preserved as much as possible to allow the sinuses to resume normal function after the surgery.  In some patients nasal polyps (excessively swollen lining of the sinuses) may be removed to relieve obstruction of the sinus openings.  The surgery is performed through the nose using lighted scopes, which eliminates the need for incisions on the face.  A septoplasty is a different procedure which is sometimes performed with sinus surgery.  It involves straightening the boy partition that separates the two sides of your nose.  A crooked or deviated septum may need repair if is obstructing the sinuses or nasal airflow.  Turbinate reduction is also often performed during sinus surgery.  The turbinates are bony proturberances from the side walls of the nose which swell and can obstruct the nose in patients with sinus and allergy problems.  Their size can be surgically reduced to help relieve nasal obstruction.  What Can Sinus Surgery Do For Me?  Sinus surgery can reduce the frequency of sinus infections requiring antibiotic treatment.  This can provide improvement in nasal congestion, post-nasal drainage, facial pressure and nasal obstruction.  Surgery will NOT prevent you from ever having an infection again, so it usually only for patients who get infections 4 or more times yearly requiring antibiotics, or for infections that do not clear with antibiotics.  It will not cure nasal allergies, so patients with allergies may still require medication to treat their allergies after surgery. Surgery may improve headaches related to sinusitis, however, some people will continue to  require medication to control sinus headaches related to allergies.  Surgery will do nothing for other forms of headache (migraine, tension or cluster).  What Are the Risks of Endoscopic Sinus Surgery?  Current techniques allow surgery to be performed safely with little risk, however, there are rare complications that patients should be aware of.  Because the sinuses are located around the eyes, there is risk of eye injury, including blindness, though again, this would be quite rare. This is usually a result of bleeding behind the eye during surgery, which can effect vision, though there are treatments to protect the vision and prevent permanent injury. More serious complications would include bleeding inside the brain cavity or damage to the brain.This happens when the fluid around the brain leaks out into the sinus cavity.  Again, all of these complications are uncommon, and spinal fluid leaks can be safely managed surgically if they occur.  The most common complication of sinus surgery is bleeding from the nose, which may require packing or cauterization of the nose.  Patients with polyps may experience recurrence of the polyps that would require revision surgery.  Alterations of sense of smell or injury to the tear ducts are also rare complications.   What is the Surgery Like, and what is the Recovery?  The Surgery usually takes a couple of hours to perform, and is usually performed under a general anesthetic (completely asleep).  Patients are usually discharged home after a couple of hours.  Sometimes during surgery it is necessary to pack the nose to control bleeding, and the packing is left in place for 24 - 48 hours, and removed by your surgeon.  If   a septoplasty was performed during the procedure, there is often a splint placed which must be removed after 5-7 days.   Discomfort: Pain is usually mild to moderate, and can be controlled by prescription pain medication or acetaminophen (Tylenol).   Aspirin, Ibuprofen (Advil, Motrin), or Naprosyn (Aleve) should be avoided, as they can cause increased bleeding.  Most patients feel sinus pressure like they have a bad head cold for several days.  Sleeping with your head elevated can help reduce swelling and facial pressure, as can ice packs over the face.  A humidifier may be helpful to keep the mucous and blood from drying in the nose.   Diet: There are no specific diet restrictions, however, you should generally start with clear liquids and a light diet of bland foods because the anesthetic can cause some nausea.  Advance your diet depending on how your stomach feels.  Taking your pain medication with food will often help reduce stomach upset which pain medications can cause.  Nasal Saline Irrigation: It is important to remove blood clots and dried mucous from the nose as it is healing.  This is done by having you irrigate the nose at least 3 - 4 times daily with a salt water solution.  We recommend using NeilMed Sinus Rinse (available at the drug store).  Fill the squeeze bottle with the solution, bend over a sink, and insert the tip of the squeeze bottle into the nose  of an inch.  Point the tip of the squeeze bottle towards the inside corner of the eye on the same side your irrigating.  Squeeze the bottle and gently irrigate the nose.  If you bend forward as you do this, most of the fluid will flow back out of the nose, instead of down your throat.   The solution should be warm, near body temperature, when you irrigate.   Each time you irrigate, you should use a full squeeze bottle.   Note that if you are instructed to use Nasal Steroid Sprays at any time after your surgery, irrigate with saline BEFORE using the steroid spray, so you do not wash it all out of the nose. Another product, Nasal Saline Gel (such as AYR Nasal Saline Gel) can be applied in each nostril 3 - 4 times daily to moisture the nose and reduce scabbing or crusting.  Bleeding:   Bloody drainage from the nose can be expected for several days, and patients are instructed to irrigate their nose frequently with salt water to help remove mucous and blood clots.  The drainage may be dark red or brown, though some fresh blood may be seen intermittently, especially after irrigation.  Do not blow you nose, as bleeding may occur. If you must sneeze, keep your mouth open to allow air to escape through your mouth.  If heavy bleeding occurs: Irrigate the nose with saline to rinse out clots, then spray the nose 3 - 4 times with Afrin Nasal Decongestant Spray.  The spray will constrict the blood vessels to slow bleeding.  Pinch the lower half of your nose shut to apply pressure, and lay down with your head elevated.  Ice packs over the nose may help as well. If bleeding persists despite these measures, you should notify your doctor.  Do not use the Afrin routinely to control nasal congestion after surgery, as it can result in worsening congestion and may affect healing.     Activity: Return to work varies among patients. Most patients will be out   of work at least 5 - 7 days to recover.  Patient may return to work after they are off of narcotic pain medication, and feeling well enough to perform the functions of their job.  Patients must avoid heavy lifting (over 10 pounds) or strenuous physical for 2 weeks after surgery, so your employer may need to assign you to light duty, or keep you out of work longer if light duty is not possible.  NOTE: you should not drive, operate dangerous machinery, do any mentally demanding tasks or make any important legal or financial decisions while on narcotic pain medication and recovering from the general anesthetic.    Call Your Doctor Immediately if You Have Any of the Following: Bleeding that you cannot control with the above measures Loss of vision, double vision, bulging of the eye or black eyes. Fever over 101 degrees Neck stiffness with severe headache,  fever, nausea and change in mental state. You are always encouraged to call anytime with concerns, however, please call with requests for pain medication refills during office hours.  Office Endoscopy: During follow-up visits your doctor will remove any packing or splints that may have been placed and evaluate and clean your sinuses endoscopically.  Topical anesthetic will be used to make this as comfortable as possible, though you may want to take your pain medication prior to the visit.  How often this will need to be done varies from patient to patient.  After complete recovery from the surgery, you may need follow-up endoscopy from time to time, particularly if there is concern of recurrent infection or nasal polyps.  

## 2022-06-01 ENCOUNTER — Ambulatory Visit: Payer: PRIVATE HEALTH INSURANCE | Admitting: Anesthesiology

## 2022-06-01 ENCOUNTER — Encounter: Payer: Self-pay | Admitting: Otolaryngology

## 2022-06-01 ENCOUNTER — Other Ambulatory Visit: Payer: Self-pay

## 2022-06-01 ENCOUNTER — Encounter
Admission: RE | Disposition: A | Discharge status: Home / Self Care | Payer: Self-pay | Source: Home / Self Care | Attending: Otolaryngology

## 2022-06-01 ENCOUNTER — Ambulatory Visit
Admission: RE | Admit: 2022-06-01 | Discharge: 2022-06-01 | Disposition: A | Discharge status: Home / Self Care | Payer: PRIVATE HEALTH INSURANCE | Attending: Otolaryngology | Admitting: Otolaryngology

## 2022-06-01 DIAGNOSIS — J342 Deviated nasal septum: Secondary | ICD-10-CM | POA: Diagnosis not present

## 2022-06-01 DIAGNOSIS — F17210 Nicotine dependence, cigarettes, uncomplicated: Secondary | ICD-10-CM | POA: Diagnosis not present

## 2022-06-01 DIAGNOSIS — J343 Hypertrophy of nasal turbinates: Secondary | ICD-10-CM | POA: Diagnosis present

## 2022-06-01 HISTORY — PX: NASAL TURBINATE REDUCTION: SHX2072

## 2022-06-01 HISTORY — DX: Essential (primary) hypertension: I10

## 2022-06-01 HISTORY — PX: SEPTOPLASTY: SHX2393

## 2022-06-01 SURGERY — SEPTOPLASTY, NOSE
Anesthesia: General | Site: Nose

## 2022-06-01 MED ORDER — PROPOFOL 10 MG/ML IV BOLUS
INTRAVENOUS | Status: DC | PRN
  Administered 2022-06-01: 200 mg via INTRAVENOUS
  Administered 2022-06-01: 50 mg via INTRAVENOUS

## 2022-06-01 MED ORDER — EPHEDRINE SULFATE (PRESSORS) 50 MG/ML IJ SOLN
INTRAMUSCULAR | Status: DC | PRN
  Administered 2022-06-01 (×3): 5 mg via INTRAVENOUS
  Administered 2022-06-01: 10 mg via INTRAVENOUS

## 2022-06-01 MED ORDER — ACETAMINOPHEN 160 MG/5ML PO SOLN: 325.0000 mg | ORAL | Status: DC | PRN

## 2022-06-01 MED ORDER — LIDOCAINE HCL 4 % MT SOLN
OROMUCOSAL | Status: DC | PRN
  Administered 2022-06-01: 4 mL via TOPICAL

## 2022-06-01 MED ORDER — FENTANYL CITRATE (PF) 100 MCG/2ML IJ SOLN
INTRAMUSCULAR | Status: DC | PRN
  Administered 2022-06-01 (×2): 50 ug via INTRAVENOUS

## 2022-06-01 MED ORDER — DEXAMETHASONE SODIUM PHOSPHATE 4 MG/ML IJ SOLN
INTRAMUSCULAR | Status: DC | PRN
  Administered 2022-06-01: 4 mg via INTRAVENOUS

## 2022-06-01 MED ORDER — SUCCINYLCHOLINE CHLORIDE 200 MG/10ML IV SOSY
PREFILLED_SYRINGE | INTRAVENOUS | Status: DC | PRN
  Administered 2022-06-01: 100 mg via INTRAVENOUS

## 2022-06-01 MED ORDER — MIDAZOLAM HCL 5 MG/5ML IJ SOLN
INTRAMUSCULAR | Status: DC | PRN
  Administered 2022-06-01: 2 mg via INTRAVENOUS

## 2022-06-01 MED ORDER — LIDOCAINE HCL (CARDIAC) PF 100 MG/5ML IV SOSY
PREFILLED_SYRINGE | INTRAVENOUS | Status: DC | PRN
  Administered 2022-06-01: 40 mg via INTRAVENOUS

## 2022-06-01 MED ORDER — OXYCODONE HCL 5 MG PO TABS
5.0000 mg | ORAL_TABLET | Freq: Once | ORAL | Status: AC | PRN
  Administered 2022-06-01: 5 mg via ORAL

## 2022-06-01 MED ORDER — PREDNISONE 10 MG PO TABS: ORAL_TABLET | ORAL | 0 refills | Status: AC

## 2022-06-01 MED ORDER — FENTANYL CITRATE PF 50 MCG/ML IJ SOSY
50.0000 ug | PREFILLED_SYRINGE | INTRAMUSCULAR | Status: AC | PRN
  Administered 2022-06-01 (×2): 25 ug via INTRAVENOUS

## 2022-06-01 MED ORDER — ACETAMINOPHEN 10 MG/ML IV SOLN
1000.0000 mg | Freq: Once | INTRAVENOUS | Status: AC
  Administered 2022-06-01: 1000 mg via INTRAVENOUS

## 2022-06-01 MED ORDER — LIDOCAINE-EPINEPHRINE 1 %-1:100000 IJ SOLN
INTRAMUSCULAR | Status: DC | PRN
  Administered 2022-06-01: 5 mL

## 2022-06-01 MED ORDER — ONDANSETRON HCL 4 MG/2ML IJ SOLN: 4.0000 mg | Freq: Once | INTRAMUSCULAR | Status: DC | PRN

## 2022-06-01 MED ORDER — ONDANSETRON HCL 4 MG/2ML IJ SOLN
INTRAMUSCULAR | Status: DC | PRN
  Administered 2022-06-01: 4 mg via INTRAVENOUS

## 2022-06-01 MED ORDER — DEXTROSE 5 % IV SOLN
2000.0000 mg | Freq: Once | INTRAVENOUS | Status: AC
  Administered 2022-06-01: 2000 mg via INTRAVENOUS

## 2022-06-01 MED ORDER — HYDROCODONE-ACETAMINOPHEN 5-325 MG PO TABS: 1.0000 | ORAL_TABLET | ORAL | 0 refills | Status: AC | PRN

## 2022-06-01 MED ORDER — GLYCOPYRROLATE 0.2 MG/ML IJ SOLN
INTRAMUSCULAR | Status: DC | PRN
  Administered 2022-06-01: .1 mg via INTRAVENOUS

## 2022-06-01 MED ORDER — OXYMETAZOLINE HCL 0.05 % NA SOLN
2.0000 | Freq: Once | NASAL | Status: AC
  Administered 2022-06-01: 2 via NASAL

## 2022-06-01 MED ORDER — CEPHALEXIN 500 MG PO CAPS: 500.0000 mg | ORAL_CAPSULE | Freq: Two times a day (BID) | ORAL | 0 refills | Status: AC

## 2022-06-01 MED ORDER — PHENYLEPHRINE HCL 0.5 % NA SOLN
NASAL | Status: DC | PRN
  Administered 2022-06-01: 30 mL via TOPICAL

## 2022-06-01 MED ORDER — LACTATED RINGERS IV SOLN: INTRAVENOUS | Status: DC

## 2022-06-01 MED ORDER — ACETAMINOPHEN 325 MG PO TABS: 325.0000 mg | ORAL_TABLET | ORAL | Status: DC | PRN

## 2022-06-01 SURGICAL SUPPLY — 29 items
CANISTER SUCT 1200ML W/VALVE (MISCELLANEOUS) ×3 IMPLANT
COAGULATOR SUCT 8FR VV (MISCELLANEOUS) ×3 IMPLANT
ELECT REM PT RETURN 9FT ADLT (ELECTROSURGICAL) ×3
ELECTRODE REM PT RTRN 9FT ADLT (ELECTROSURGICAL) ×2 IMPLANT
GLOVE SURG GAMMEX PI TX LF 7.5 (GLOVE) ×6 IMPLANT
GOWN STRL REUS W/ TWL LRG LVL3 (GOWN DISPOSABLE) ×2 IMPLANT
GOWN STRL REUS W/TWL LRG LVL3 (GOWN DISPOSABLE) ×3
KIT TURNOVER KIT A (KITS) ×3 IMPLANT
NDL ANESTHESIA 27G X 3.5 (NEEDLE) ×2 IMPLANT
NDL HYPO 27GX1-1/4 (NEEDLE) ×2 IMPLANT
NEEDLE ANESTHESIA  27G X 3.5 (NEEDLE) ×1
NEEDLE ANESTHESIA 27G X 3.5 (NEEDLE) ×2 IMPLANT
NEEDLE HYPO 27GX1-1/4 (NEEDLE) ×3 IMPLANT
NS IRRIG 500ML POUR BTL (IV SOLUTION) ×3 IMPLANT
PACK ENT CUSTOM (PACKS) ×3 IMPLANT
PACKING NASAL EPIS 4X2.4 XEROG (MISCELLANEOUS) ×3 IMPLANT
PATTIES SURGICAL .5 X3 (DISPOSABLE) ×3 IMPLANT
SOL ANTI-FOG 6CC FOG-OUT (MISCELLANEOUS) ×2 IMPLANT
SOL FOG-OUT ANTI-FOG 6CC (MISCELLANEOUS) ×1
SPLINT NASAL SEPTAL BLV .50 ST (MISCELLANEOUS) ×3 IMPLANT
STRAP BODY AND KNEE 60X3 (MISCELLANEOUS) ×3 IMPLANT
SUT CHROMIC 3-0 (SUTURE) ×6
SUT CHROMIC 3-0 KS 27XMFL CR (SUTURE) ×4
SUT ETHILON 3-0 KS 30 BLK (SUTURE) ×3 IMPLANT
SUT PLAIN GUT 4-0 (SUTURE) ×3 IMPLANT
SUTURE CHRMC 3-0 KS 27XMFL CR (SUTURE) IMPLANT
SYR 3ML LL SCALE MARK (SYRINGE) ×3 IMPLANT
TOWEL OR 17X26 4PK STRL BLUE (TOWEL DISPOSABLE) ×3 IMPLANT
WATER STERILE IRR 250ML POUR (IV SOLUTION) ×3 IMPLANT

## 2022-06-01 NOTE — Anesthesia Postprocedure Evaluation (Signed)
Anesthesia Post Note  Patient: Lawrence Cervantes  Procedure(s) Performed: SEPTOPLASTY (Nose) INFERIOR TURBINATE REDUCTION (Bilateral: Nose)     Patient location during evaluation: PACU Anesthesia Type: General Level of consciousness: awake Pain management: pain level controlled Vital Signs Assessment: post-procedure vital signs reviewed and stable Respiratory status: respiratory function stable Cardiovascular status: stable Postop Assessment: no signs of nausea or vomiting Anesthetic complications: no   No notable events documented.  Jola Babinski

## 2022-06-01 NOTE — H&P (Signed)
H&P has been reviewed and patient reevaluated, no changes necessary. To be downloaded later.  

## 2022-06-01 NOTE — Anesthesia Preprocedure Evaluation (Signed)
Anesthesia Evaluation  Patient identified by MRN, date of birth, ID band Patient awake    Reviewed: Allergy & Precautions, NPO status   Airway Mallampati: II  TM Distance: >3 FB     Dental   Pulmonary Current Smoker and Patient abstained from smoking.,    Pulmonary exam normal        Cardiovascular hypertension,  Rhythm:Regular Rate:Normal     Neuro/Psych    GI/Hepatic   Endo/Other    Renal/GU Renal disease (kidney stones)     Musculoskeletal   Abdominal   Peds  Hematology   Anesthesia Other Findings   Reproductive/Obstetrics                             Anesthesia Physical Anesthesia Plan  ASA: 2  Anesthesia Plan: General   Post-op Pain Management:    Induction: Intravenous  PONV Risk Score and Plan: Ondansetron, Dexamethasone, Midazolam and Treatment may vary due to age or medical condition  Airway Management Planned: Oral ETT  Additional Equipment:   Intra-op Plan:   Post-operative Plan:   Informed Consent: I have reviewed the patients History and Physical, chart, labs and discussed the procedure including the risks, benefits and alternatives for the proposed anesthesia with the patient or authorized representative who has indicated his/her understanding and acceptance.     Dental advisory given  Plan Discussed with: CRNA  Anesthesia Plan Comments:         Anesthesia Quick Evaluation

## 2022-06-01 NOTE — Transfer of Care (Signed)
Immediate Anesthesia Transfer of Care Note  Patient: Lawrence Cervantes  Procedure(s) Performed: SEPTOPLASTY (Nose) INFERIOR TURBINATE REDUCTION (Bilateral: Nose)  Patient Location: PACU  Anesthesia Type: General  Level of Consciousness: awake, alert  and patient cooperative  Airway and Oxygen Therapy: Patient Spontanous Breathing and Patient connected to supplemental oxygen  Post-op Assessment: Post-op Vital signs reviewed, Patient's Cardiovascular Status Stable, Respiratory Function Stable, Patent Airway and No signs of Nausea or vomiting  Post-op Vital Signs: Reviewed and stable  Complications: No notable events documented.

## 2022-06-01 NOTE — Anesthesia Procedure Notes (Signed)
Procedure Name: Intubation Date/Time: 06/01/2022 10:07 AM  Performed by: Jimmy Picket, CRNAPre-anesthesia Checklist: Patient identified, Emergency Drugs available, Suction available, Patient being monitored and Timeout performed Patient Re-evaluated:Patient Re-evaluated prior to induction Oxygen Delivery Method: Circle system utilized Preoxygenation: Pre-oxygenation with 100% oxygen Induction Type: IV induction Ventilation: Mask ventilation without difficulty Laryngoscope Size: Miller and 3 Grade View: Grade I Tube type: Oral Rae Tube size: 7.5 mm Number of attempts: 1 Placement Confirmation: ETT inserted through vocal cords under direct vision, positive ETCO2 and breath sounds checked- equal and bilateral Tube secured with: Tape Dental Injury: Teeth and Oropharynx as per pre-operative assessment

## 2022-06-02 ENCOUNTER — Encounter: Payer: Self-pay | Admitting: Otolaryngology
